# Patient Record
Sex: Male | Born: 1940 | Race: White | Hispanic: No | Marital: Married | Smoking: Never smoker
Health system: Southern US, Community
[De-identification: ages and names within clinical notes are randomized; demographics above are authoritative.]

---

## 2000-11-20 ENCOUNTER — Encounter: Payer: Self-pay | Admitting: Occupational Medicine

## 2000-11-20 ENCOUNTER — Encounter: Admission: RE | Admit: 2000-11-20 | Discharge: 2000-11-20 | Payer: Self-pay | Admitting: Occupational Medicine

## 2003-10-12 ENCOUNTER — Encounter: Admission: RE | Admit: 2003-10-12 | Discharge: 2003-10-12 | Payer: Self-pay | Admitting: Internal Medicine

## 2003-10-13 ENCOUNTER — Encounter: Admission: RE | Admit: 2003-10-13 | Discharge: 2003-10-13 | Payer: Self-pay | Admitting: Internal Medicine

## 2003-11-29 ENCOUNTER — Encounter: Admission: RE | Admit: 2003-11-29 | Discharge: 2003-11-29 | Payer: Self-pay | Admitting: Internal Medicine

## 2004-01-05 ENCOUNTER — Encounter: Admission: RE | Admit: 2004-01-05 | Discharge: 2004-01-05 | Payer: Self-pay | Admitting: Internal Medicine

## 2004-01-10 ENCOUNTER — Encounter: Admission: RE | Admit: 2004-01-10 | Discharge: 2004-01-10 | Payer: Self-pay | Admitting: Internal Medicine

## 2004-03-27 ENCOUNTER — Encounter: Admission: RE | Admit: 2004-03-27 | Discharge: 2004-03-27 | Payer: Self-pay | Admitting: Internal Medicine

## 2004-08-31 ENCOUNTER — Ambulatory Visit: Payer: Self-pay | Admitting: Internal Medicine

## 2004-09-20 ENCOUNTER — Ambulatory Visit: Payer: Self-pay | Admitting: Internal Medicine

## 2004-10-19 ENCOUNTER — Ambulatory Visit: Payer: Self-pay | Admitting: Internal Medicine

## 2004-11-06 ENCOUNTER — Ambulatory Visit: Payer: Self-pay | Admitting: Internal Medicine

## 2004-11-19 HISTORY — PX: REPLACEMENT TOTAL KNEE BILATERAL: SUR1225

## 2004-12-27 ENCOUNTER — Ambulatory Visit: Payer: Self-pay | Admitting: Internal Medicine

## 2005-06-18 ENCOUNTER — Ambulatory Visit: Payer: Self-pay | Admitting: Internal Medicine

## 2005-07-18 ENCOUNTER — Ambulatory Visit (HOSPITAL_BASED_OUTPATIENT_CLINIC_OR_DEPARTMENT_OTHER): Admission: RE | Admit: 2005-07-18 | Discharge: 2005-07-18 | Payer: Self-pay | Admitting: Urology

## 2005-07-18 ENCOUNTER — Ambulatory Visit (HOSPITAL_COMMUNITY): Admission: RE | Admit: 2005-07-18 | Discharge: 2005-07-18 | Payer: Self-pay | Admitting: Urology

## 2005-11-14 ENCOUNTER — Inpatient Hospital Stay (HOSPITAL_COMMUNITY): Admission: RE | Admit: 2005-11-14 | Discharge: 2005-11-18 | Payer: Self-pay | Admitting: Orthopedic Surgery

## 2005-11-14 DIAGNOSIS — Z96659 Presence of unspecified artificial knee joint: Secondary | ICD-10-CM

## 2005-12-27 ENCOUNTER — Ambulatory Visit: Payer: Self-pay | Admitting: Internal Medicine

## 2005-12-31 ENCOUNTER — Ambulatory Visit: Payer: Self-pay | Admitting: Internal Medicine

## 2006-06-21 ENCOUNTER — Ambulatory Visit: Payer: Self-pay | Admitting: Hospitalist

## 2006-07-30 ENCOUNTER — Ambulatory Visit: Payer: Self-pay | Admitting: Internal Medicine

## 2006-10-02 DIAGNOSIS — E785 Hyperlipidemia, unspecified: Secondary | ICD-10-CM

## 2006-10-02 DIAGNOSIS — I10 Essential (primary) hypertension: Secondary | ICD-10-CM | POA: Insufficient documentation

## 2006-10-28 ENCOUNTER — Ambulatory Visit: Payer: Self-pay | Admitting: Internal Medicine

## 2006-10-29 ENCOUNTER — Encounter (INDEPENDENT_AMBULATORY_CARE_PROVIDER_SITE_OTHER): Payer: Self-pay | Admitting: *Deleted

## 2006-10-29 ENCOUNTER — Ambulatory Visit: Payer: Self-pay | Admitting: Internal Medicine

## 2006-10-29 LAB — CONVERTED CEMR LAB
ALT: 24 units/L (ref 0–53)
AST: 18 units/L (ref 0–37)
Albumin: 4.6 g/dL (ref 3.5–5.2)
Alkaline Phosphatase: 97 units/L (ref 39–117)
BUN: 17 mg/dL (ref 6–23)
CO2: 30 meq/L (ref 19–32)
Calcium: 9.6 mg/dL (ref 8.4–10.5)
Chloride: 102 meq/L (ref 96–112)
Cholesterol: 248 mg/dL — ABNORMAL HIGH (ref 0–200)
Creatinine, Ser: 1.13 mg/dL (ref 0.40–1.50)
Glucose, Bld: 101 mg/dL — ABNORMAL HIGH (ref 70–99)
HCT: 48.6 % (ref 41.0–49.0)
HDL: 33 mg/dL — ABNORMAL LOW (ref >39)
Hemoglobin: 16 g/dL (ref 13.9–16.8)
LDL Cholesterol: 179 mg/dL — ABNORMAL HIGH (ref 0–99)
MCHC: 32.9 g/dL — ABNORMAL LOW (ref 33.1–35.4)
MCV: 99.2 fL (ref 78.8–100.0)
Platelets: 239 10*3/uL (ref 152–374)
Potassium: 4.6 meq/L (ref 3.5–5.3)
RBC: 4.9 M/uL (ref 4.20–5.50)
RDW: 13.5 % (ref 11.5–15.3)
Sodium: 144 meq/L (ref 135–145)
TSH: 0.969 microintl units/mL (ref 0.350–5.50)
Total Bilirubin: 0.9 mg/dL (ref 0.3–1.2)
Total CHOL/HDL Ratio: 7.5
Total Protein: 7.1 g/dL (ref 6.0–8.3)
Triglycerides: 181 mg/dL — ABNORMAL HIGH (ref <150)
VLDL: 36 mg/dL (ref 0–40)
WBC: 7.1 10*3/uL (ref 3.7–10.0)

## 2006-11-14 ENCOUNTER — Inpatient Hospital Stay (HOSPITAL_COMMUNITY): Admission: RE | Admit: 2006-11-14 | Discharge: 2006-11-17 | Payer: Self-pay | Admitting: Orthopedic Surgery

## 2006-11-23 DIAGNOSIS — J301 Allergic rhinitis due to pollen: Secondary | ICD-10-CM

## 2006-11-27 DIAGNOSIS — M1990 Unspecified osteoarthritis, unspecified site: Secondary | ICD-10-CM | POA: Insufficient documentation

## 2006-11-27 DIAGNOSIS — N471 Phimosis: Secondary | ICD-10-CM

## 2006-11-27 DIAGNOSIS — Z85820 Personal history of malignant melanoma of skin: Secondary | ICD-10-CM | POA: Insufficient documentation

## 2006-11-27 DIAGNOSIS — N478 Other disorders of prepuce: Secondary | ICD-10-CM | POA: Insufficient documentation

## 2006-12-02 ENCOUNTER — Ambulatory Visit: Payer: Self-pay | Admitting: Hospitalist

## 2006-12-04 ENCOUNTER — Encounter: Admission: RE | Admit: 2006-12-04 | Discharge: 2007-01-16 | Payer: Self-pay | Admitting: Orthopedic Surgery

## 2006-12-05 ENCOUNTER — Ambulatory Visit: Payer: Self-pay | Admitting: Internal Medicine

## 2006-12-05 LAB — CONVERTED CEMR LAB: INR: 1.6

## 2006-12-10 ENCOUNTER — Ambulatory Visit: Payer: Self-pay | Admitting: Internal Medicine

## 2006-12-10 ENCOUNTER — Encounter (INDEPENDENT_AMBULATORY_CARE_PROVIDER_SITE_OTHER): Payer: Self-pay | Admitting: *Deleted

## 2006-12-10 LAB — CONVERTED CEMR LAB: INR: 1.9

## 2007-03-11 ENCOUNTER — Telehealth (INDEPENDENT_AMBULATORY_CARE_PROVIDER_SITE_OTHER): Payer: Self-pay | Admitting: *Deleted

## 2007-03-12 ENCOUNTER — Ambulatory Visit: Payer: Self-pay | Admitting: Internal Medicine

## 2007-03-12 ENCOUNTER — Encounter (INDEPENDENT_AMBULATORY_CARE_PROVIDER_SITE_OTHER): Payer: Self-pay | Admitting: *Deleted

## 2007-03-12 LAB — CONVERTED CEMR LAB
ALT: 22 units/L (ref 0–53)
Albumin: 4.7 g/dL (ref 3.5–5.2)
CO2: 26 meq/L (ref 19–32)
Chloride: 101 meq/L (ref 96–112)
Glucose, Bld: 91 mg/dL (ref 70–99)
Potassium: 4 meq/L (ref 3.5–5.3)
Sodium: 141 meq/L (ref 135–145)
Total Protein: 7.6 g/dL (ref 6.0–8.3)

## 2007-03-13 ENCOUNTER — Telehealth (INDEPENDENT_AMBULATORY_CARE_PROVIDER_SITE_OTHER): Payer: Self-pay | Admitting: *Deleted

## 2007-10-15 ENCOUNTER — Telehealth (INDEPENDENT_AMBULATORY_CARE_PROVIDER_SITE_OTHER): Payer: Self-pay | Admitting: *Deleted

## 2007-12-05 ENCOUNTER — Encounter (INDEPENDENT_AMBULATORY_CARE_PROVIDER_SITE_OTHER): Payer: Self-pay | Admitting: *Deleted

## 2008-04-20 ENCOUNTER — Telehealth (INDEPENDENT_AMBULATORY_CARE_PROVIDER_SITE_OTHER): Payer: Self-pay | Admitting: *Deleted

## 2008-06-28 ENCOUNTER — Ambulatory Visit: Payer: Self-pay | Admitting: Internal Medicine

## 2008-06-28 ENCOUNTER — Encounter (INDEPENDENT_AMBULATORY_CARE_PROVIDER_SITE_OTHER): Payer: Self-pay | Admitting: *Deleted

## 2008-06-28 LAB — CONVERTED CEMR LAB
ALT: 31 units/L (ref 0–53)
BUN: 15 mg/dL (ref 6–23)
CO2: 28 meq/L (ref 19–32)
Calcium: 9.6 mg/dL (ref 8.4–10.5)
Chloride: 101 meq/L (ref 96–112)
Cholesterol: 171 mg/dL (ref 0–200)
Creatinine, Ser: 1.05 mg/dL (ref 0.40–1.50)
Potassium: 4.1 meq/L (ref 3.5–5.3)
Sodium: 144 meq/L (ref 135–145)
Total CHOL/HDL Ratio: 4.9

## 2008-08-03 IMAGING — CR DG CHEST 2V
2 series · 2 of 2 positions shown · non-contrast
Comparison: 11/05/05.

CLINICAL DATA: Osteoarthritis, right knee.  Preoperative chest.
 CHEST - 2 VIEW:

[view not recorded (1 of 2)]
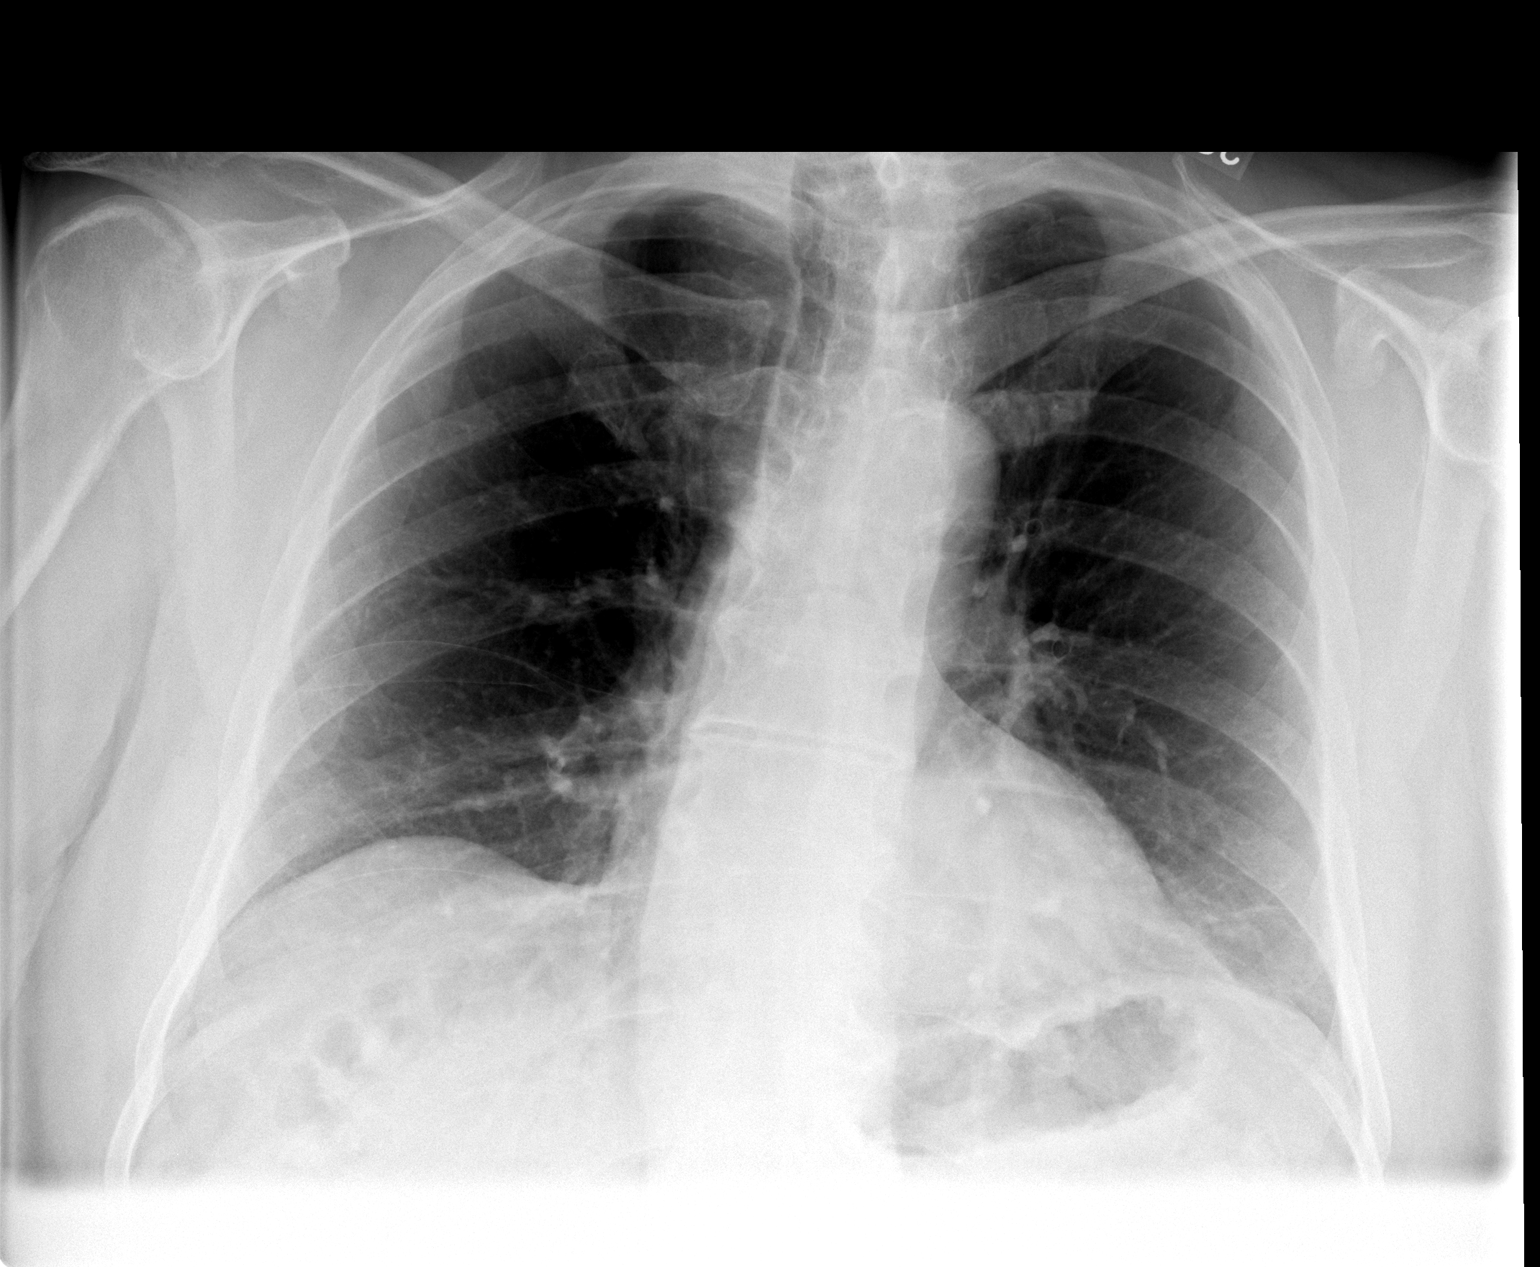

[view not recorded (2 of 2)]
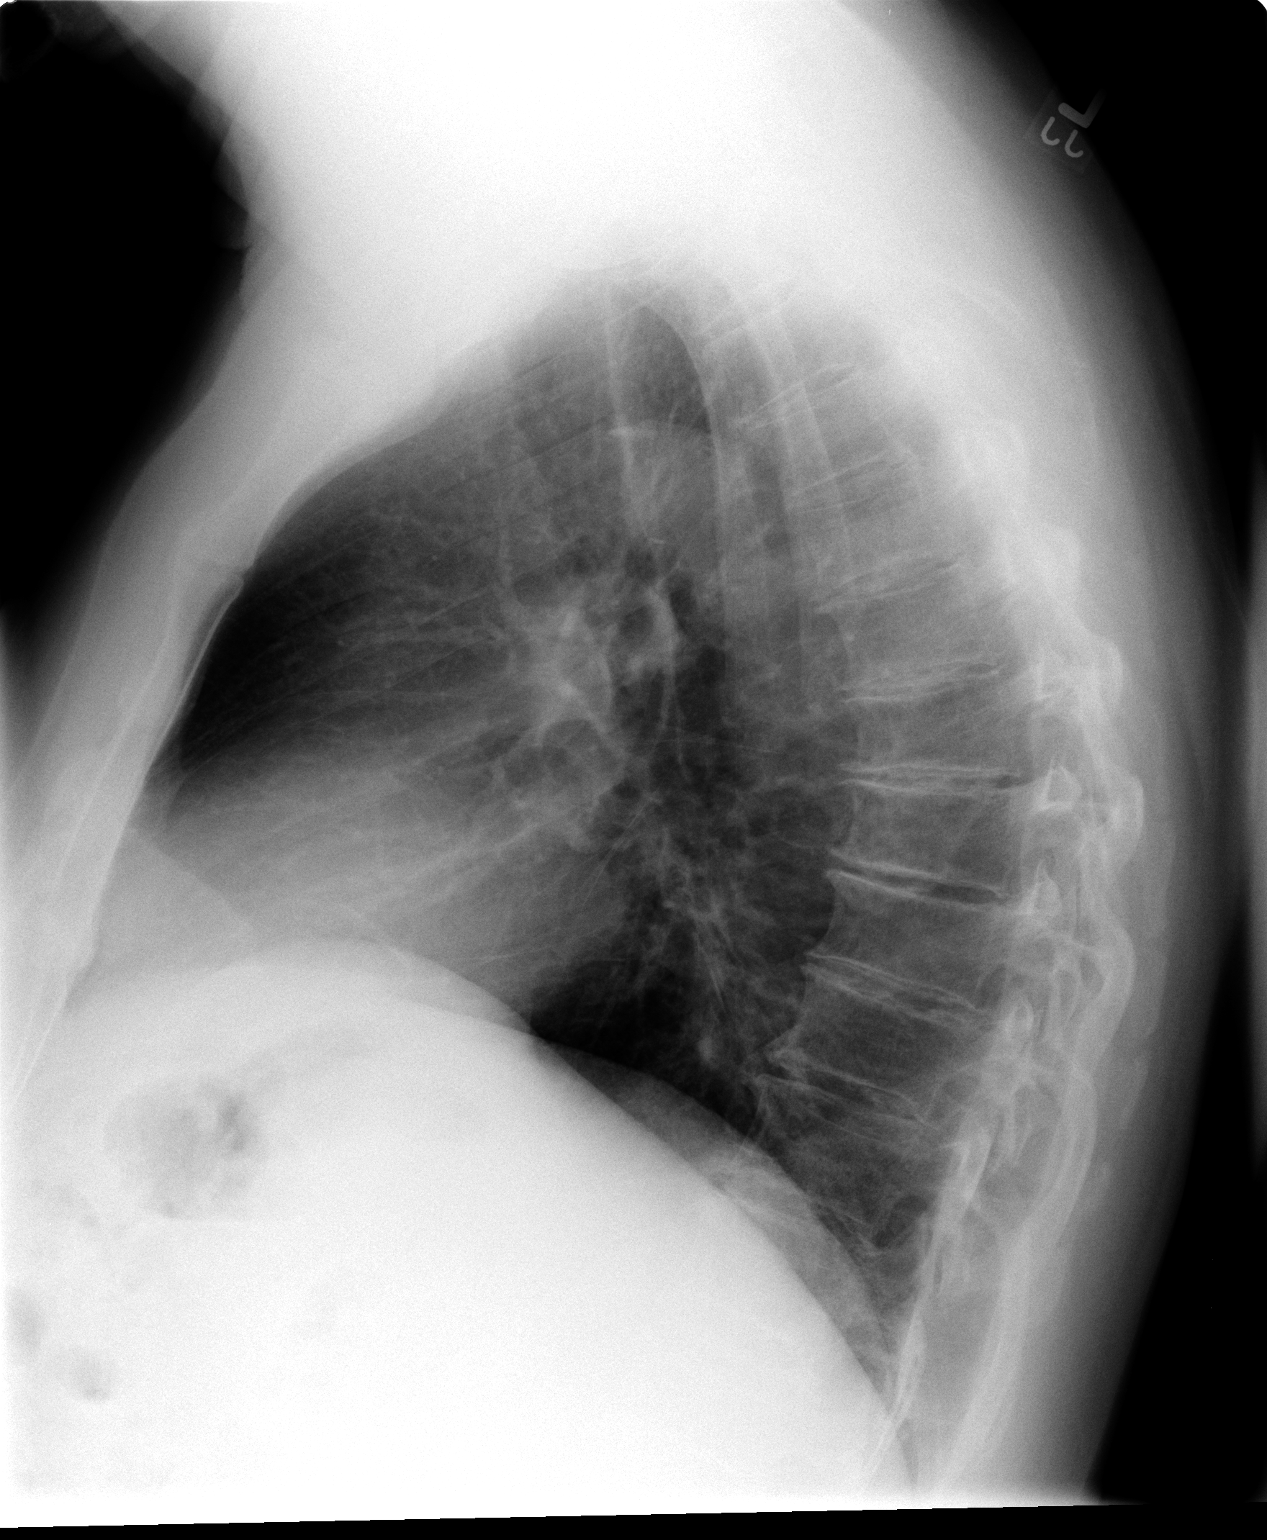

[2 of 2 positions shown; findings below may reference images not displayed]

The lungs are clear. The heart and mediastinal structures are normal.  There are mild chronic bronchitic changes.  There is mild scoliosis--stable.
IMPRESSION: Mild stable chronic bronchitic changes.  No evidence for active chest disease radiographically.

## 2008-08-12 IMAGING — CR DG KNEE 1-2V*R*
2 series · 2 of 2 positions shown · non-contrast
Comparison: none

CLINICAL DATA: OA right knee. Postop total knee replacement.
 RIGHT KNEE ? 2 VIEW:

[view not recorded (1 of 2)]
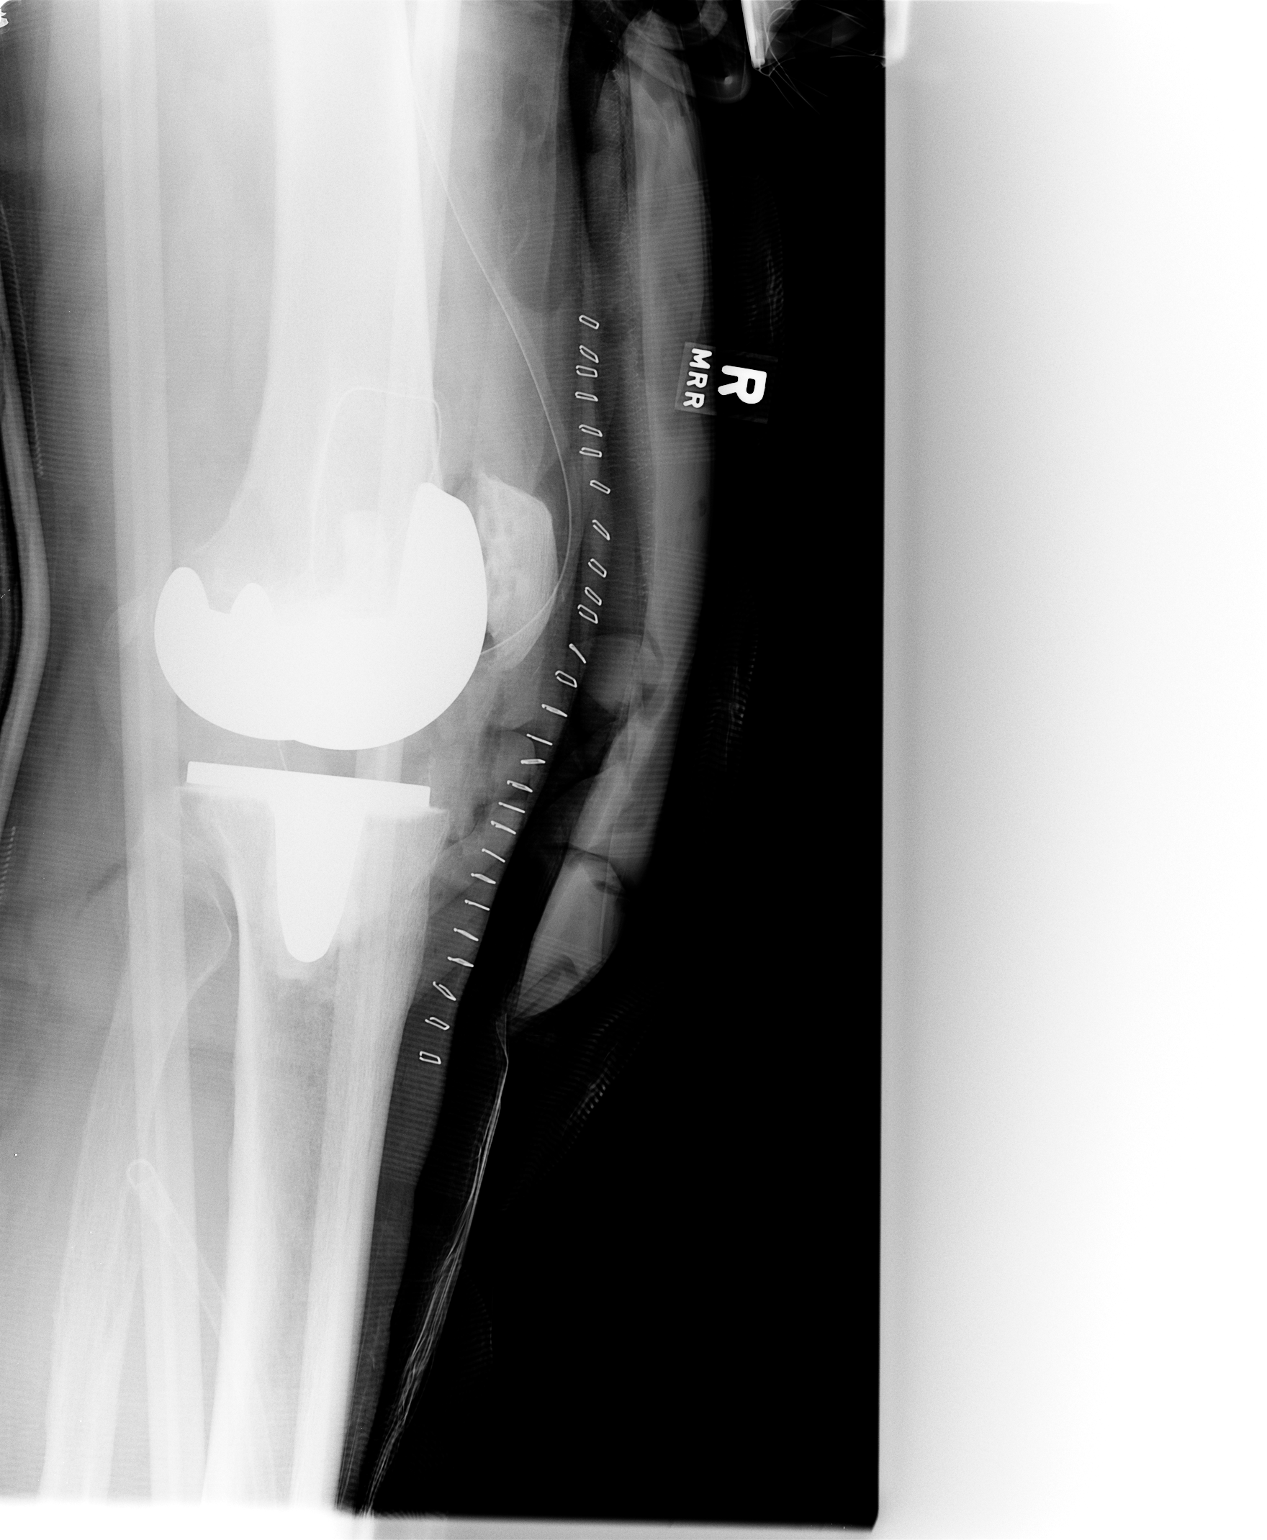

[view not recorded (2 of 2)]
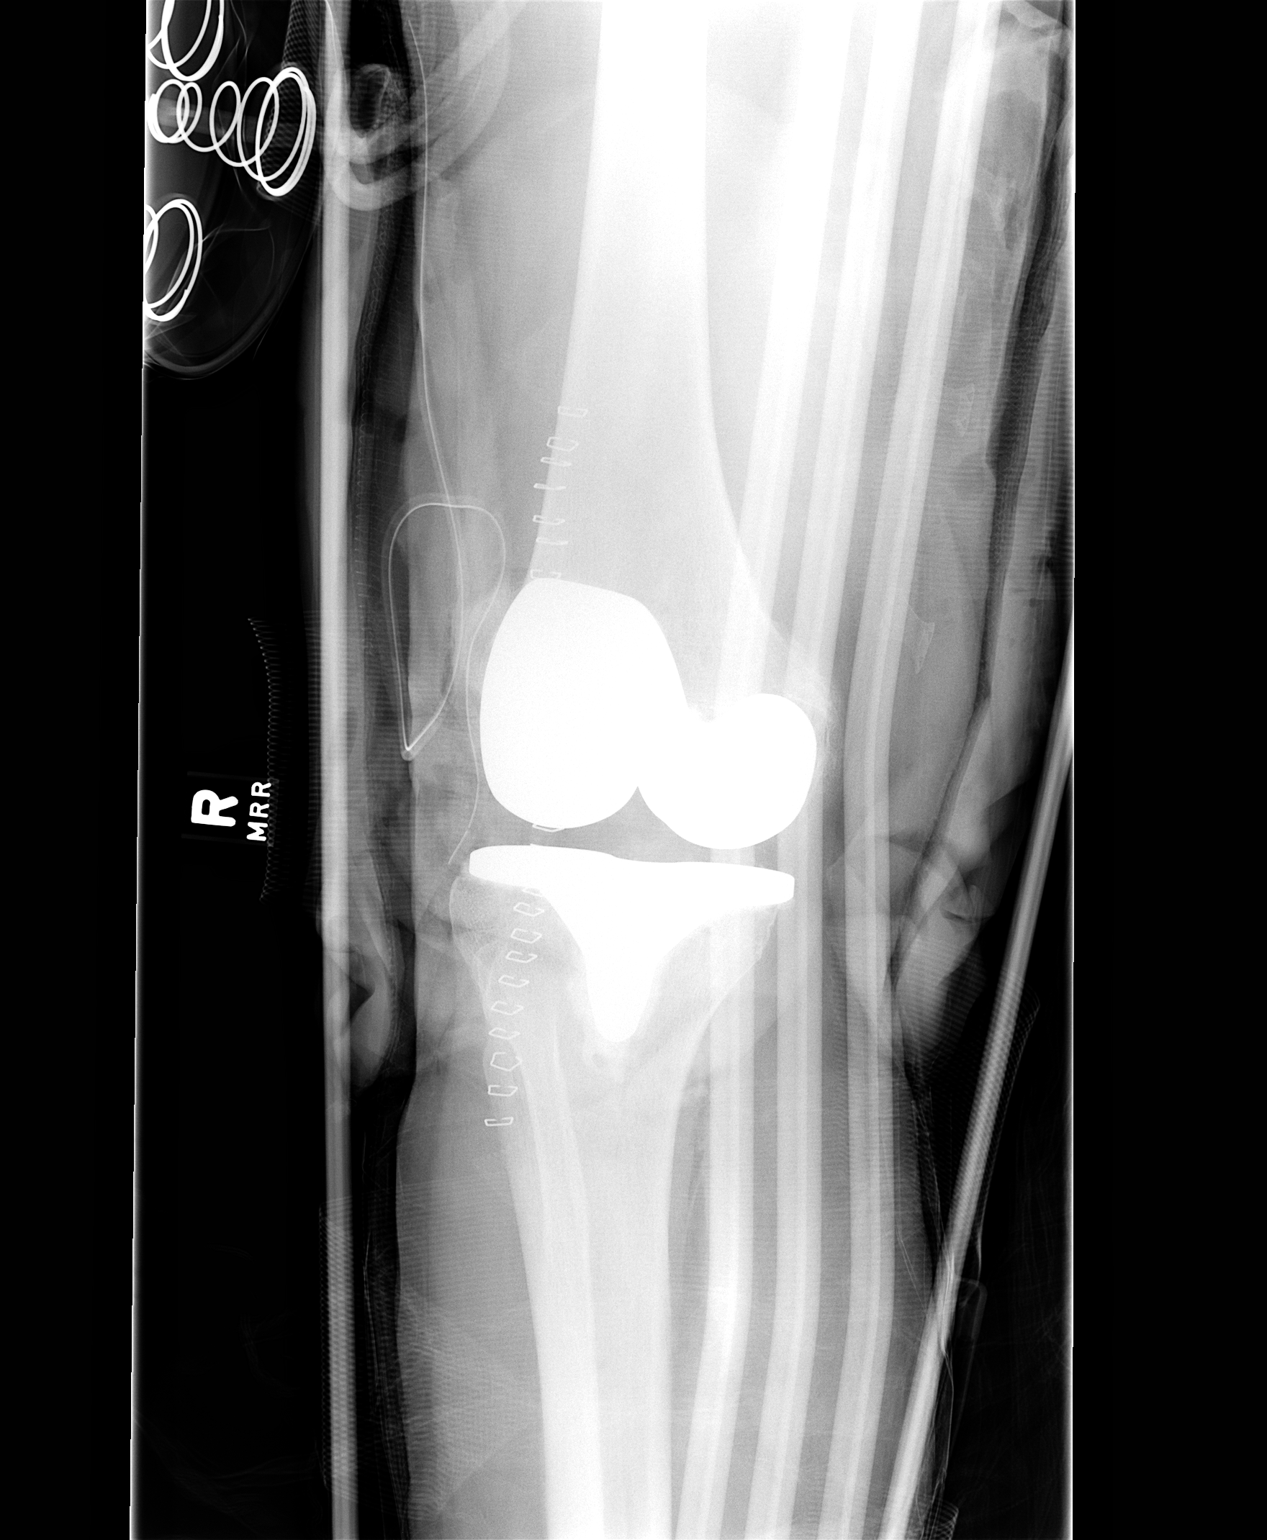

[2 of 2 positions shown; findings below may reference images not displayed]

FINDINGS: Received are 2 views which are not true AP and lateral views.  The views were taken at some degree of obliquity.
 Distal femoral proximal tibia and patella prosthetic components appear in satisfactory position and alignment.  Vertical row of superficial staples anteriorly.  Radiopaque drain is also noted.
IMPRESSION: Satisfactory appearance of right total knee replacement.

## 2008-10-25 ENCOUNTER — Telehealth: Payer: Self-pay | Admitting: Internal Medicine

## 2008-12-17 ENCOUNTER — Ambulatory Visit: Payer: Self-pay | Admitting: Family Medicine

## 2008-12-17 DIAGNOSIS — K219 Gastro-esophageal reflux disease without esophagitis: Secondary | ICD-10-CM

## 2009-01-21 ENCOUNTER — Encounter: Payer: Self-pay | Admitting: Family Medicine

## 2009-01-21 ENCOUNTER — Telehealth (INDEPENDENT_AMBULATORY_CARE_PROVIDER_SITE_OTHER): Payer: Self-pay | Admitting: *Deleted

## 2009-01-31 ENCOUNTER — Ambulatory Visit: Payer: Self-pay | Admitting: Family Medicine

## 2009-02-01 LAB — CONVERTED CEMR LAB
AST: 23 units/L (ref 0–37)
Calcium: 9.2 mg/dL (ref 8.4–10.5)
Chloride: 103 meq/L (ref 96–112)
Creatinine, Ser: 1.1 mg/dL (ref 0.4–1.5)
GFR calc Af Amer: 85 mL/min
GFR calc non Af Amer: 71 mL/min
LDL Cholesterol: 114 mg/dL — ABNORMAL HIGH (ref 0–99)
PSA: 2.31 ng/mL (ref 0.10–4.00)
Total Bilirubin: 1.1 mg/dL (ref 0.3–1.2)
Total CHOL/HDL Ratio: 5.4
Triglycerides: 55 mg/dL (ref 0–149)

## 2009-02-07 ENCOUNTER — Ambulatory Visit: Payer: Self-pay | Admitting: Family Medicine

## 2009-02-07 DIAGNOSIS — E119 Type 2 diabetes mellitus without complications: Secondary | ICD-10-CM

## 2009-02-08 LAB — CONVERTED CEMR LAB
Creatinine,U: 116.5 mg/dL
Hgb A1c MFr Bld: 6.8 % — ABNORMAL HIGH (ref 4.6–6.5)
Microalb Creat Ratio: 6 mg/g (ref 0.0–30.0)
Microalb, Ur: 0.7 mg/dL (ref 0.0–1.9)

## 2009-03-03 ENCOUNTER — Ambulatory Visit: Payer: Self-pay | Admitting: Family Medicine

## 2009-03-03 DIAGNOSIS — J019 Acute sinusitis, unspecified: Secondary | ICD-10-CM

## 2009-03-10 ENCOUNTER — Ambulatory Visit: Payer: Self-pay | Admitting: Family Medicine

## 2009-03-11 ENCOUNTER — Encounter (INDEPENDENT_AMBULATORY_CARE_PROVIDER_SITE_OTHER): Payer: Self-pay | Admitting: *Deleted

## 2009-04-01 ENCOUNTER — Telehealth: Payer: Self-pay | Admitting: Family Medicine

## 2009-05-09 ENCOUNTER — Ambulatory Visit: Payer: Self-pay | Admitting: Family Medicine

## 2009-05-16 ENCOUNTER — Ambulatory Visit: Payer: Self-pay | Admitting: Family Medicine

## 2009-05-16 LAB — CONVERTED CEMR LAB
Calcium: 9.3 mg/dL (ref 8.4–10.5)
GFR calc non Af Amer: 63.63 mL/min (ref >60)
HDL goal, serum: 40 mg/dL
Hgb A1c MFr Bld: 6.1 % (ref 4.6–6.5)
LDL Cholesterol: 100 mg/dL — ABNORMAL HIGH (ref 0–99)
LDL Goal: 100 mg/dL
Potassium: 3.9 meq/L (ref 3.5–5.1)
Sodium: 143 meq/L (ref 135–145)
VLDL: 12.8 mg/dL (ref 0.0–40.0)

## 2009-08-10 ENCOUNTER — Ambulatory Visit: Payer: Self-pay | Admitting: Family Medicine

## 2009-08-23 ENCOUNTER — Ambulatory Visit: Payer: Self-pay | Admitting: Family Medicine

## 2010-02-13 ENCOUNTER — Ambulatory Visit: Payer: Self-pay | Admitting: Family Medicine

## 2010-02-15 LAB — CONVERTED CEMR LAB
ALT: 25 units/L (ref 0–53)
Albumin: 4 g/dL (ref 3.5–5.2)
BUN: 19 mg/dL (ref 6–23)
Chloride: 101 meq/L (ref 96–112)
Cholesterol: 132 mg/dL (ref 0–200)
Creatinine, Ser: 1.1 mg/dL (ref 0.4–1.5)
GFR calc non Af Amer: 70.19 mL/min (ref >60)
Glucose, Bld: 117 mg/dL — ABNORMAL HIGH (ref 70–99)
LDL Cholesterol: 87 mg/dL (ref 0–99)
Total Bilirubin: 0.9 mg/dL (ref 0.3–1.2)
Triglycerides: 50 mg/dL (ref 0.0–149.0)

## 2010-03-29 ENCOUNTER — Ambulatory Visit: Payer: Self-pay | Admitting: Family Medicine

## 2010-12-19 NOTE — Assessment & Plan Note (Signed)
Summary: 6 MONTH FOLLOW UP DM/RBH   Vital Signs:  Patient profile:   70 year old male Height:      70 inches Weight:      232 pounds BMI:     33.41 Temp:     98.2 degrees F oral Pulse rate:   68 / minute Pulse rhythm:   regular BP sitting:   130 / 62  (left arm) Cuff size:   regular  Vitals Entered By: Linde Gillis CMA Duncan Dull) (Mar 29, 2010 2:35 PM) CC: 6 month follow up   History of Present Illness: Feeling well overeall, but alot of stress..so not taking care of himself.  not sticking to diet, weight gain 10 lbs, limited exercsie.  Started walking 2 times a week. Not checking blood sugar at home.   Problems Prior to Update: 1)  Special Screening Malig Neoplasms Other Sites  (ICD-V76.49) 2)  Sinusitis - Acute-nos  (ICD-461.9) 3)  Dm  (ICD-250.00) 4)  Special Screening Malignant Neoplasm of Prostate  (ICD-V76.44) 5)  Gerd  (ICD-530.81) 6)  Knee Replacement, Right, Hx of  (ICD-V43.65) 7)  Coumadin Therapy  (ICD-V58.61) 8)  Hx, Personal, Malignancy, Skin Melanoma  (ICD-V10.82) 9)  Redundant Prepuce/phimosis  (ICD-605) 10)  Osteoarthritis  (ICD-715.90) 11)  Knee Replacement, Left, Hx of  (ICD-V43.65) 12)  Allergic Rhinitis, Seasonal  (ICD-477.0) 13)  Hypertension  (ICD-401.9) 14)  Hyperlipidemia  (ICD-272.4)  Current Medications (verified): 1)  Hydrochlorothiazide 25 Mg Tabs (Hydrochlorothiazide) .... Take 1 Tablet By Mouth Once A Day 2)  Zyrtec 10 Mg Tabs (Cetirizine Hcl) .... Take 1 Tablet By Mouth Once A Day As Needed 3)  Aspir-Low 81 Mg Tbec (Aspirin) .... Take 1 Tablet By Mouth Once A Day 4)  Tylenol Extra Strength 500 Mg Tabs (Acetaminophen) .... As Needed 5)  Nasonex 50 Mcg/act Susp (Mometasone Furoate) .... 2 Sprays in Each Nostril Once Daily As Needed 6)  Lipitor 20 Mg Tabs (Atorvastatin Calcium) .... Take 1 Tablet By Mouth Once A Day  Allergies (verified): No Known Drug Allergies  Past History:  Past medical, surgical, family and social histories (including  risk factors) reviewed, and no changes noted (except as noted below).  Past Medical History: Reviewed history from 12/17/2008 and no changes required. Hyperlipidemia, LDL 179 Hypertension Seasonal allergies h/o melanoma, excised 7/06 Dr. Lonni Fix Osteoarthritis s/p L TKR 2006 R TKR Dec 2007 GERD  Past Surgical History: Reviewed history from 12/17/2008 and no changes required. Total knee replacemen bilateralt:  Dec 06 left and right 12007 circumsicion 1993  Family History: Reviewed history from 12/17/2008 and no changes required. father:allergies, HTN, colon cancer age 29 mother: hip fracture, A fib? on coumadin unsure why MGM: CVA MGF: CVA PGF: CAD  Social History: Reviewed history from 12/17/2008 and no changes required. Occupation: retired Married 4 children: healthy except allergies Former Smoker 10 pack year history Alcohol use-no Drug use-no Regular exercise-yes Diet: changing to healthy diet  Review of Systems General:  Denies fatigue and fever. CV:  Denies chest pain or discomfort. Resp:  Denies shortness of breath. GI:  Denies abdominal pain. GU:  Denies dysuria.  Physical Exam  General:  Overweight appearing male in NAd Mouth:  MMM Neck:  no carotid bruit or thyromegaly no cervical or supraclavicular lymphadenopathy  Lungs:  Normal respiratory effort, chest expands symmetrically. Lungs are clear to auscultation, no crackles or wheezes. Abdomen:  Bowel sounds positive,abdomen soft and non-tender without masses, organomegaly or hernias noted. Pulses:  R and L posterior tibial pulses are  full and equal bilaterally  Extremities:  no edema, B varicosities healing right lower leg..skin cancer biopsy Skin:  multiple areas treated at [facility] recently,  Diabetes Management Exam:    Foot Exam (with socks and/or shoes not present):       Sensory-Pinprick/Light touch:          Left medial foot (L-4): normal          Left dorsal foot (L-5): normal          Left  lateral foot (S-1): normal          Right medial foot (L-4): normal          Right dorsal foot (L-5): normal          Right lateral foot (S-1): normal       Sensory-Monofilament:          Left foot: normal          Right foot: normal       Inspection:          Left foot: normal          Right foot: normal       Nails:          Left foot: normal          Right foot: normal   Impression & Recommendations:  Problem # 1:  DM (ICD-250.00) Worsened control. Get back on control with diet. Encouraged exercise, weight loss, healthy eating habits.  His updated medication list for this problem includes:    Aspir-low 81 Mg Tbec (Aspirin) .Marland Kitchen... Take 1 tablet by mouth once a day  Problem # 2:  HYPERTENSION (ICD-401.9) Well controrlled.Marland Kitchenat goal <130/80.  His updated medication list for this problem includes:    Hydrochlorothiazide 25 Mg Tabs (Hydrochlorothiazide) .Marland Kitchen... Take 1 tablet by mouth once a day  Problem # 3:  HYPERLIPIDEMIA (ICD-272.4) Improved control.  His updated medication list for this problem includes:    Lipitor 20 Mg Tabs (Atorvastatin calcium) .Marland Kitchen... Take 1 tablet by mouth once a day  Labs Reviewed: SGOT: 21 (02/13/2010)   SGPT: 25 (02/13/2010)  Lipid Goals: Chol Goal: 200 (05/16/2009)   HDL Goal: 40 (05/16/2009)   LDL Goal: 100 (05/16/2009)   TG Goal: 150 (05/16/2009)  Prior 10 Yr Risk Heart Disease: 40 % (08/23/2009)   HDL:35.00 (02/13/2010), 34.00 (05/09/2009)  LDL:87 (02/13/2010), 100 (04/54/0981)  Chol:132 (02/13/2010), 147 (05/09/2009)  Trig:50.0 (02/13/2010), 64.0 (05/09/2009)  Complete Medication List: 1)  Hydrochlorothiazide 25 Mg Tabs (Hydrochlorothiazide) .... Take 1 tablet by mouth once a day 2)  Zyrtec 10 Mg Tabs (Cetirizine hcl) .... Take 1 tablet by mouth once a day as needed 3)  Aspir-low 81 Mg Tbec (Aspirin) .... Take 1 tablet by mouth once a day 4)  Tylenol Extra Strength 500 Mg Tabs (Acetaminophen) .... As needed 5)  Nasonex 50 Mcg/act Susp  (Mometasone furoate) .... 2 sprays in each nostril once daily as needed 6)  Lipitor 20 Mg Tabs (Atorvastatin calcium) .... Take 1 tablet by mouth once a day  Patient Instructions: 1)  Schedule CPX in 3 month.  2)  HgBA1c prior to visit  ICD-9: 250.00 3)  Urine Microalbumin prior to visit ICD-9 :   Current Allergies (reviewed today): No known allergies

## 2011-04-06 NOTE — Op Note (Signed)
NAME:  Preston Green, Preston Green                 ACCOUNT NO.:  [id number]   MEDICAL RECORD NO.:  [id number]          PATIENT TYPE:  AMB   LOCATION:  [facility]                         FACILITY:  [facility]   PHYSICIAN:  Valetta Fuller, M.D.  DATE OF BIRTH:  [birth year]   DATE OF PROCEDURE:  07/18/2005  DATE OF DISCHARGE:                                 OPERATIVE REPORT   PREOPERATIVE DIAGNOSIS:  Severe phimosis with recurrent balanitis.   POSTOPERATIVE DIAGNOSIS:  Severe phimosis with recurrent balanitis.   PROCEDURE PERFORMED:  Circumcision.   SURGEON:  Valetta Fuller, M.D.   ANESTHESIA:  IV sedation with penile block.   INDICATIONS:  Mr. Flinchum is a 70 year old male.  He presented with a several  year history of increasing difficulty and now inability to retract his  foreskin.  He had some problems with recurrent balanitis and had some  bleeding and discomfort with intercourse.  On exam, the patient had severe  phimosis and we were unable to retract his foreskin.  For that reason we  recommended consideration for circumcision.  The patient appeared understand  the advantages and disadvantages as well as potential complications.   DESCRIPTION OF PROCEDURE:  The patient was brought to the operating room  where he had intravenous sedation administered.  A penile block was then  performed with lidocaine and Marcaine.  The penis was then prepped and  draped in the usual sterile manner. Of note, no latex was used during the  course the procedure because of a history of latex allergy.  The patient was  then prepped and draped in the usual manner.  An additional penile block was  performed and the anesthesia was excellent.  We performed a dorsal slit  initially to expose the glans penis. There was marked erythema and chronic  fibrotic changes consistent with the severe phimosis and recurrent  balanitis.  A circumferential incision was made around the glans penis and  then a second incision was made within the  very inflamed and scarred mucosal  collar.  The redundant sleeve of tissue was removed.  The area was copiously  irrigated, and the glans penis was reprepped.  The skin edges were  reapproximated with interrupted 4-0 Vicryl suture.  A very light pressure  dressing with Xeroform and Bacitracin was applied.  The patient appeared to  tolerate the procedure well.  There were no obvious complications.           ______________________________  Valetta Fuller, M.D.     DSG/MEDQ  D:  07/18/2005  T:  07/18/2005  Job:  [id number]

## 2011-04-06 NOTE — Op Note (Signed)
NAMEMarland Kitchen  Preston Green, Preston Green                 ACCOUNT NO.:  [id number]   MEDICAL RECORD NO.:  [id number]          PATIENT TYPE:  INP   LOCATION:  1508                         FACILITY:  [facility]   PHYSICIAN:  Georges Lynch. Gioffre, M.D.DATE OF BIRTH:  02-06-1941   DATE OF PROCEDURE:  11/14/2005  DATE OF DISCHARGE:                                 OPERATIVE REPORT   SURGEON:  Georges Lynch. Darrelyn Hillock, M.D.   ASSISTANT:  Jamelle Rushing, P.A.   PREOPERATIVE DIAGNOSIS:  Severe degenerative arthritis, left knee.   POSTOPERATIVE DIAGNOSIS:  Severe degenerative arthritis, left knee.   OPERATION:  Left total knee arthroplasty utilizing the DePuy system. All  three components were cemented. I utilized the rotating platform tibial  insert. The sizes used were as follows:  The tibial tray was a size 5, the  tibial insert was a size 5, 10 mm thickness, the patella was a size 41 mm.  The femoral component was a size five left posterior cruciate sacrificing  type, and the tibial tray was a size 5. Note all three components were  cemented and vancomycin was used in the cement.   DESCRIPTION OF PROCEDURE:  Under spinal anesthesia, routine orthopedic prep  and draping of the left lower extremity was carried out. He had 1 gram of IV  Ancef preop. The leg was exsanguinated with an Esmarch and tourniquet was  elevated at 375 mmHg. Note he had a flexion contracture preop. At this time,  an incision was made over the anterior aspect of the left knee with the knee  flexed, bleeders identified and cauterized. Two flaps were created. Self-  retaining retractors were inserted. I then carried out a median parapatellar  incision, reflected the patella laterally, flexed the knee and did lateral  medial meniscectomies and then excised the anterior and posterior cruciate  ligaments. We then removed all the spurs from the patella, femur and tibia.  Next the initial drill hole was made in the intercondylar notch of the  femur, we was  thoroughly irrigated out the femoral canal. A #1 jig was  inserted, we removed 12 mm thickness off the distal femur. Next, a #2 jig  was inserted for a size 5 femoral component and at this time we carried our  anterior, posterior and chamfering cuts. Following that, we then prepared  our tibia in the usual fashion utilizing intramedullary guides. We initially  removed 6 mm off the unaffected side and then took another additional 4 mm  off the unaffected side. We then noted that when we withdrew the trials we  had good stability. Prior to doing the trials, we did our notch cut out of  the femur in the usual fashion for a size five left femur. We then inserted  our trials and went through trial range of motion. We had good lateral and  medial stability, had good flexion extension with a 10 mm thickness tray in  place. We then prepared our patella, we removed the appropriate amount of  bone from our patella. We did a resurfacing procedure. Three drill holes  then were made  in the patella for a size 41 patella. All the components were  removed, we thoroughly water picked out the knee,  removed all the soft  tissue that was basically as far as doing a synovectomy. We then dried the  knee out, we inserted some Gelfoam into the femoral canal and tibial canal  and then cemented all three components in simultaneously. VANCOMYCIN WAS  USED IN THE CEMENT. Once the cement was dry, we removed all loose pieces of  cement. We took our trial tibial insert and we looked posteriorly and made  sure there were no other loose pieces of cement present. We thoroughly water  picked out the knee again and we then inserted our permanent rotating  platform size 5, 10 mm thickness,  reduced the knee,  had good stability, good flexion, good extension. We  inserted our hemovac drain and then closed the knee in layers in the usual  fashion. Sterile dressings were applied. Surgeon, Dr. Darrelyn Hillock. Assistant,  Arlyn Leak,  PA.           ______________________________  Georges Lynch Darrelyn Hillock, M.D.     RAG/MEDQ  D:  11/14/2005  T:  11/14/2005  Job:  [id number]

## 2011-04-06 NOTE — Discharge Summary (Signed)
NAMEMarland Green  MURPHY, DUZAN                 ACCOUNT NO.:  [id number]   MEDICAL RECORD NO.:  [id number]          PATIENT TYPE:  INP   LOCATION:  1508                         FACILITY:  [facility]   PHYSICIAN:  Georges Lynch. Gioffre, M.D.DATE OF BIRTH:  [birth year]   DATE OF ADMISSION:  11/14/2005  DATE OF DISCHARGE:  11/18/2005                                 DISCHARGE SUMMARY   ADMISSION DIAGNOSES:  1.  Severe osteoarthritis of the knee.  2.  Hypertension.  3.  History of kidney stones.  4.  History of seasonal allergies.   DISCHARGE DIAGNOSES:  1.  Left total knee arthroplasty.  2.  History of hypertension.  3.  History of kidney stones.  4.  History of seasonal allergies.   HISTORY OF PRESENT ILLNESS:  The patient is a 70 year old gentleman  evaluated by Dr. Darrelyn Hillock for bilateral knee pains. He has failed  conservative treatment in his left knee including medications, arthroscopies  and injections. X-rays reveal significant arthritic changes throughout the  knee. He has significant pain at night plus difficulty with sleep. He has a  constant aching sensation which increases with any type of activity.   ALLERGIES:  RINDEE given in the 1980s.   CURRENT MEDICATIONS:  1.  Hydrochlorothiazide 25 mg a day.  2.  Zyrtec 10 mg a day.  3.  Lipitor 10 mg a day.  4.  Enalapril 40 mg a day.  5.  Nasonex spray p.r.n.   PROCEDURE:  On November 14, 2005, the patient was taken to the OR by Windy Fast  A. Gioffre, M.D., assisted by Jamelle Rushing, PA-C and under spinal  anesthesia the patient underwent a left total knee arthroplasty with the  following DePuy components:  A cemented 5 keel tibial tray, cemented 5 left  femoral component and a size 5, 10 mm polyethylene bearing, a size 41 three  peg patella. All components were implanted with polymethyl methacrylate and  vancomycin. The patient tolerated the procedure well and was transferred to  the recovery room and then to the orthopedic floor good  condition.   CONSULTATIONS:  The following routine consults requested:  Physical therapy,  occupational therapy, case management.   HOSPITAL COURSE:  The patient was admitted to [facility] on  November 14, 2005 under the care of Dr. Darrelyn Hillock. The patient was taken to  the OR where a left total knee arthroplasty was performed without any  complications. The patient tolerated the procedure well and was transferred  to the recovery room and then to the orthopedic floor with IV pain  medications, antibiotics starting on subcu heparin and Coumadin for DVT  prophylaxis and follow routine total knee protocol.   The patient then incurred a total of 3 days postoperative care on the  orthopedic floor in which the patient did develop some insignificant blood  loss. His vital signs remained stable without any problems with physical  therapy and any other activity. The patient's wound remained benign for any  signs of infection and his leg remained neuromotor vascular intact. The  patient worked well on a day to  day basis with physical therapy. He had no  other significant complaints and it was felt that on postoperative day #3  that he was orthopedically and medically ready for discharge home.  Arrangements were made for outpatient home health physical therapy and he  was discharged home in good condition.   LABORATORY DATA:  CBC on December 28, WBC was 9.5, hemoglobin 12.5,  hematocrit 37.0, platelets 190.   Routine chemistries on December 28, sodium 138, potassium of 5, glucose 145,  BUN 15, creatinine 1.1. Elevated glucose was felt to be due to inactivity  and routine postop strep and was just monitored. INR on December 31 was 2.1.   MEDICATIONS UPON DISCHARGE FROM THE ORTHOPEDIC FLOOR:  1.  Heparin 5000 units subcu q.12 h, discontinued due to therapeutic INR.  2.  Ferrous sulfate 325 mg p.o. t.i.d.  3.  Percocet 1 or 2 tablets every 4-6 h p.r.n.  4.  Reglan 10 mg p.o. q.8 h  p.r.n.  5.  Phenergan 25 mg p.o. q.6 h p.r.n.  6.  Robaxin 500 mg p.o. q.6 h p.r.n.  7.  Hydrochlorothiazide 25 mg p.o. daily.  8.  Claritin 10 mg p.o. p.r.n.  9.  Lipitor 40 mg a day.  10. Vasotec 40 mg a day.  11. Nasonex spray p.r.n.  12. Coumadin 7.5 mg a day and adjusted by pharmacy.   DISCHARGE INSTRUCTIONS:  1.  Diet - no restrictions.  2.  Activity - the patient is to walk with assistance with the use of      crutches or walker.  3.  Wound care - the patient should have dressing changed daily.  4.  Medications - the patient is to resume routine home medications with the      addition of the Percocet and Robaxin as directed. :  5.  Followup - the patient needs a followup appointment with Dr. Darrelyn Hillock 2      weeks from surgical date. He needs to      call the office to set up the appointment.  6.  [facility] provides physical therapy and INR evaluations.   CONDITION ON DISCHARGE:  Improved and good.      Jamelle Rushing, P.A.    ______________________________  Georges Lynch Darrelyn Hillock, M.D.    RWK/MEDQ  D:  11/29/2005  T:  11/30/2005  Job:  [id number]

## 2011-04-06 NOTE — H&P (Signed)
Preston Green, Preston Green             ACCOUNT NO.:  [id number]   MEDICAL RECORD NO.:  [id number]           PATIENT TYPE:   LOCATION:                                 FACILITY:   PHYSICIAN:  Georges Lynch. Gioffre, M.D.DATE OF BIRTH:  [birth year]   DATE OF ADMISSION:  11/14/2006  DATE OF DISCHARGE:                              HISTORY & PHYSICAL   CHIEF COMPLAINT:  Right knee pain.   HISTORY OF PRESENT ILLNESS:  The patient is a 70 year old gentleman  patient familiar with Dr. Darrelyn Hillock.  He has been evaluated and treated  for long periods of time for his arthritis in his right knee.  The  patient has noted loss of range of motion and pain with ambulation.  It  remains swollen.  The knee is no longer getting improvement with  conservative treatment.  The patient would like to proceed with a total  knee arthroplasty.   ALLERGIES:  NO KNOWN DRUG ALLERGIES.   CURRENT MEDICATIONS:  1. A water pill 25 mg a day.  2. Aspirin 81 mg a day.  3. Occasional over-the-counter allergy meds.   PRIMARY CARE PHYSICIAN:  Dr. Mervin Hack.   PAST SURGICAL HISTORY:  Positive for:  1. A left total knee arthroplasty.  2. Circumcision.  3. Left knee arthroscopy.  4. Removal of some skin lesions.   The patient denies any complications with anesthesia with the above-  mentioned surgical procedures other than constipation with narcotics.   FAMILY MEDICAL HISTORY:  Grandparents both had cardiac disease and are  deceased.  Mother is alive at 68 years of age with no significant  medical issues.  Father just has arthritis.   SOCIAL HISTORY:  The patient is married.  He is retired.  He lives in a  [place] house.  He denies any history of alcohol or tobacco use.   REVIEW OF SYSTEMS:  Positive for occasional headache, which have  significantly improved since retiring.  He does have a history of  hemorrhoids in the past, ulcers and kidney stones.  Otherwise, no  cardiac, respiratory, GI, GU or hematological issues that  is unchanged.   PHYSICAL EXAMINATION:  VITAL SIGNS:  Height is 6 feet.  Weight is 238.  Blood pressure 168/98, pulse 74 and regular, respirations 12.  The  patient is afebrile.  GENERAL:  This is a healthy-appearing, well-developed, conscious, alert  and appropriate 70 year old gentleman who appears to be in no extreme  distress.  HEENT:  Head was normocephalic.  Pupils equal, round and reactive.  Extraocular movements intact.  Oral buccal mucosa was pink and moist.  NECK:  Supple.  No palpable lymphadenopathy.  He had good range of  motion of his cervical spine without difficulty or tenderness.  CHEST:  He is slightly kyphotic but no signs of any wheezes, rales or  rhonchi.  His lung sounds were clear.  HEART:  Regular rate and rhythm.  No murmurs, rubs or gallops.  ABDOMEN:  Soft and nontender.  Bowel sounds present.  No CVA region  tenderness.  EXTREMITIES:  Upper Extremities:  Symmetrical in size and shape.  He had  full range of motion of his shoulders, elbows and wrists.  Motor  strength was 5/5.  Lower Extremities:  Right and left hips had full  extension and flexion up to 120 degrees with 20 degrees of internal and  external rotation without any discomfort.  Left knee had a well-healed  midline surgical incision.  He was able to fully extend and flex it back  to about 110 degrees.  Right knee was round and boggy-appearing.  No  signs of erythema or ecchymosis.  No effusion.  He was tender along the  joint lines medially and laterally.  He was able to extend it to about  10 degrees short of full extension and flex it back to about 95 degrees.  He had no instability.  Calves were soft and nontender.  The ankles were  symmetrical with good dorsi and plantar flexion.  PERIPHERAL VASCULAR:  Carotid pulses were 2+.  No bruits.  Radial pulses  were 2+.  Dorsalis pedis were 1+.  He had no lower extremity edema or  venous stasis changes.  NEURO:  The patient was conscious, alert and  appropriate.  Easy  conversation with examiner.  He had no gross neurologic defects noted.   Breast, rectal and GU exams were deferred at this time.   IMPRESSION:  1. End-stage osteoarthritis, right knee.  2. Hypertension.  3. History of kidney stones.  4. History of recent left total knee arthroplasty.   PLAN:  The patient is in the process of going through a medical  clearance for this upcoming surgical procedure.  He will undergo all  routine labs and tests prior to having a right total knee arthroplasty  by Dr. Darrelyn Hillock at [facility] on December 27.      Jamelle Rushing, P.A.    ______________________________  Georges Lynch Darrelyn Hillock, M.D.    RWK/MEDQ  D:  10/29/2006  T:  10/29/2006  Job:  [id number]   cc:   Windy Fast A. Darrelyn Hillock, M.D.  Fax: [phone]

## 2011-04-06 NOTE — Op Note (Signed)
NAMEBOSCO, PAPARELLA                 ACCOUNT NO.:  [id number]   MEDICAL RECORD NO.:  [id number]          PATIENT TYPE:  INP   LOCATION:  X005                         FACILITY:  [facility]   PHYSICIAN:  Georges Lynch. Gioffre, M.D.DATE OF BIRTH:  [birth year]   DATE OF PROCEDURE:  11/14/2006  DATE OF DISCHARGE:                               OPERATIVE REPORT   SURGEON:  Georges Lynch. Darrelyn Hillock, M.D.   OPERATIONS:  Arlyn Leak, PA.   PREOPERATIVE DIAGNOSIS:  Severe degenerative arthritis, right knee.   POSTOPERATIVE DIAGNOSIS:  Severe degenerative arthritis, right knee.   OPERATION:  Right total knee arthroplasty.   The system used was a Publishing rights manager.  We utilized the  Smart Set bone cement with vancomycin.  The sizes of the prosthesis was  a size 5 right posterior cruciate-sacrificing femoral component, a size  5 tibial tray with a size 5, 10 mm thickness insert.  The patella was a  size 41 mm patella.  All three components were cemented.   PROCEDURE:  Under spinal anesthesia, routine orthopedic prep and draping  of the right lower extremity was carried out.  The patient had had 2 g  of IV Ancef preop.  The leg was exsanguinated with an Esmarch and the  tourniquet was elevated at 375 mmHg.  Following that, an incision was  made over the anterior aspect of the right knee, bleeders identified and  cauterized.  Two flaps were created.  I then carried out a median  parapatellar incision.  I reflected the patella laterally, flexed the  knee, and did medial and lateral meniscectomies, and excised the  anterior and posterior cruciate ligaments.  Following that, initial  drill hole was placed  in the intercondylar notch of the femur.  The  guide rod was inserted and a 12 mm thickness was taken off the distal  femur.  The #2 jig was inserted.  We measured the femur to be a size 5.  The appropriate cuts were made with the third jig.  We carried out  anterior, posterior and chamfering cuts.   Once the femur was prepared,  we then prepared the tibia.  The tibial plateau was measured be a size  5.  We made our appropriate cuts.  We removed 6 mm thickness off the  affected medial side.  We then cut our keel cut out of the tibial  plateau in the usual fashion.  We then went back to the femur and cut  our notch cut out of the femur in the usual fashion.  We thoroughly  irrigated the knee out.  We then utilized our spacers to measure our  tension and our gaps in flexion and extension.  Following that, we then  went on and cut our patella for a size 41 patella.  We did a resurfacing-  type patella.  Three drill holes were made in the patella.  We selected  a size 41 mm patella.  We thoroughly water picked out the knee, cleaned  the knee out, and then cemented all three components in simultaneously.  All loose pieces  of cement were removed.  We then removed the trial  tibial insert, once again inspected for cement.  We then inserted our  permanent size 5, 10 mm thickness tibial rotating platform  insert, reduced the knee, took the knee through motion.  We had  excellent function and excellent stability.  We then thoroughly water  picked out the knee again inserted a Hemovac drain and closed the knee  in layers in the usual fashion.  Sterile Neosporin dressings were  applied.           ______________________________  Georges Lynch Darrelyn Hillock, M.D.     RAG/MEDQ  D:  11/14/2006  T:  11/14/2006  Job:  [id number]

## 2011-04-06 NOTE — H&P (Signed)
NAME:  Preston Green, Preston Green                 ACCOUNT NO.:  [id number]   MEDICAL RECORD NO.:  [id number]          PATIENT TYPE:  INP   LOCATION:  NA                           FACILITY:  [facility]   PHYSICIAN:  Georges Lynch. Gioffre, M.D.DATE OF BIRTH:  Jun 10, 1941   DATE OF ADMISSION:  11/14/2005  DATE OF DISCHARGE:                                HISTORY & PHYSICAL   CHIEF COMPLAINT:  Left knee pain.   HISTORY OF PRESENT ILLNESS:  The patient is a 70 year old gentleman who has  been evaluated by Dr. Darrelyn Hillock for bilateral knee pain.  He has failed  conservative treatment including medications and arthroscopy. X-rays reveal  significant arthritic changes throughout his left knee.  He does have  significant pain at night causing very difficult sleep with constant deep  aching sensation that increases with activity.  He is in today for  evaluation for a left total knee arthroplasty by Dr. Darrelyn Hillock.   ALLERGIES:  RINDEE given in the 1980s.   CURRENT MEDICATIONS:  1.  Hydrochlorothiazide 25 mg a day.  2.  Zyrtec 10 mg a day.  3.  Lipitor 40 mg a day.  4.  Enalapril 2 tablets 20 mg a day.  5.  Nasonex nasal spray p.r.n.   PAST MEDICAL HISTORY:  1.  Hypertension.  2.  History of kidney stones.  3.  Osteoarthritis bilateral knees.   PAST SURGICAL HISTORY:  1.  Circumcision.  2.  Left knee arthroscopy.   The patient indicates the only complication was some hyperreactivity after  awakening from his left knee surgery.   SOCIAL HISTORY:  The patient is married.  He is currently retired.  Lives in  a [place] home. Denies any history of smoking or alcohol use or children.  His wife will be his care giver postop.   FAMILY MEDICAL HISTORY:  Grandparents both with cardiac disease and are  deceased.  Mother is alive at 84 without any significant medical issues.  Father is 21 with arthritis.   REVIEW OF SYSTEMS:  Positive for history of tension headaches related to  work which resolved after retiring.   Hemorrhoids, ulcers, history of kidney  stones requiring lithotripsy.  History of skin lesions removed earlier this  year which were not malignant.  Otherwise other Review of Systems categories  were negative.   PHYSICAL EXAMINATION:  VITAL SIGNS:  Height 6 feet.  Weight 125.  Blood  pressure 148/70, pulse 80, respirations 12, temperature 98.  GENERAL:  This is a healthy-appearing, well-developed, conscious, alert,  appropriate 70 year old white male who transitions fairly easily on and off,  but he does appear to have some stiffness in bilateral knees.  HEENT:  Head was normocephalic.  Pupils equal, round, and reactive.  Extraocular movements intact.  Sclerae not icteric.  External ears were  without deformity.  Gross hearing is intact.  NECK:  Supple.  No palpable lymphadenopathy.  Thyroid region was nontender.  He had good range of motion of the cervical spine without any discomfort.  CHEST:  Lung sounds were clear and equal bilaterally.  No wheezes, rales,  rhonchi.  HEART: Regular rate and rhythm.  No murmurs, rubs, or gallops.  ABDOMEN:  Soft, nontender.  Bowel sounds normoactive.  No CVA region  discomfort.  UPPER EXTREMITIES:  Symmetric size and shape.  He had good range of motion  of shoulders, elbows, wrists.  Motor strength was 5/5.  LOWER EXTREMITIES:  Right and left hip had full extension and flexion up to  120 degrees with 20 internal and external rotation without any discomfort.  Left knee was full appearing, no signs of erythema, no ecchymosis.  He  lacked about 10 degrees extension, flexed it back to about 90 degrees with  just some general crepitus within the knees and just soreness throughout.  Calf was soft and nontender.  Right knee was full appearing.  No signs of  erythema or ecchymosis.  No effusion.  He had about 5 degrees lack of full  extension, flexed that back to about 100 degrees with some crepitus on the  patella.  No other instability.  The calves were  soft, nontender.  The  ankles were symmetrical with good dorsiflexion and plantar flexion.  PERIPHERAL VASCULAR:  Carotid pulses were 2+, bruits. Radial pulses were 2+.  Dorsalis pedis pulses were 2+.  He had trace edema in the lower extremities.  NEUROLOGIC: The patient was conscious, alert, and appropriate, held easy  conversation with examiner.  Cranial nerves II-XII grossly intact.  He had  no gross neurologic defects noted.  BREASTS/RECTAL/GU: Exams deferred at this time.   IMPRESSION:  1.  Severe osteoarthritis, left knee.  2.  Hypertension.  3.  History of kidney stones.  4.  History of seasonal allergies.   PLAN:  The patient will undergo all the routine lab and tests prior to  having a left total knee arthroplasty by Dr. Darrelyn Hillock at [facility]  on November 14, 2005.      Jamelle Rushing, P.A.    ______________________________  Georges Lynch Darrelyn Hillock, M.D.    RWK/MEDQ  D:  11/05/2005  T:  11/05/2005  Job:  [id number]

## 2011-04-06 NOTE — Discharge Summary (Signed)
Preston Green, Preston Green                 ACCOUNT NO.:  [id number]   MEDICAL RECORD NO.:  [id number]          PATIENT TYPE:  INP   LOCATION:  1519                         [facility]ACILITY:  [facility]   PHYSICIAN:  Georges Lynch. Gioffre, M.D.DATE OF BIRTH:  August 23, 1941   DATE OF ADMISSION:  11/14/2006  DATE OF DISCHARGE:  11/17/2006                               DISCHARGE SUMMARY   ADMISSION DIAGNOSES:  1. End-stage osteoarthritis right knee.  2. Hypertension.  3. History of kidney stones.  4. History of recent left total knee arthroplasty.   DISCHARGE DIAGNOSES:  1. Right total knee arthroplasty.  2. Hypertension.  3. History of kidney stones.  4. History of left total knee arthroplasty.   HISTORY OF PRESENT ILLNESS:  The patient is a 70 year old gentleman with  issues related to pain and range of motion of his right knee.  He has  been treated for long periods of time with conservative treatment for  his arthritis.  The patient is starting to lose range of motion and has  increased pain with ambulation, remains swollen.  Conservative treatment  is failing.  The patient like to proceed with a total knee arthroplasty.   ALLERGIES:  No known drug allergies.   CURRENT MEDICATIONS:  1. Hydrochlorothiazide 25 mg a day.  2. Aspirin 81 mg a day.  3. Tylenol p.r.n.  4. Zyrtec 10 mg a day.  5. Nasonex p.r.n.   SURGICAL PROCEDURE:  On November 14, 2006, the patient was taken to the  OR by Dr. Worthy Rancher, assisted by Arlyn Leak, PA-C.  Under general  anesthesia the patient underwent a right total knee arthroplasty without  any complications.  Estimated blood loss was about 50 mL.  The patient  tolerated the procedure well.  The patient had the following components  implanted:  A size 41 mm three-peg patella, a size 5 right-side femoral  component, a size 5 keeled tibial tray, a size 5 10-mm polyethylene  bearing.  All components were implanted with polymethyl methacrylate  with vancomycin mixed in.   The patient was transferred to the recovery  room and then to the orthopedic floor in good condition to follow  routine protocol.   CONSULTS:  The following routine consults requested:  Physical therapy,  case management and pharmacy.   HOSPITAL COURSE:  On the December 27 the patient was admitted to [facility] under the care of Dr. Worthy Rancher.  The patient was taken  to the OR where a right total knee arthroplasty was performed.  The  patient tolerated the procedure well, was transferred to the recovery  room and then the orthopedic floor in good condition on IV pain  medicines, antibiotics, and starting his DVT protocol.  The patient then  incurred a total of 3 days postoperative care on the orthopedic floor in  which the patient's wound remained benign for any signs of infection.  The patient's leg remained neurologically intact.  The patient's vital  signs remained stable.  The patient had no medical complications.  The  patient worked well with physical therapy and it was  felt on  postoperative day #3 the patient was ready for discharge home.  His  wound did have a little bit of skin blisters but just local wound care  was necessary, so he was discharged home in good condition with  outpatient home health physical therapy arranged and follow-up  instructions.   LABORATORY:  H&H on December 30 found hemoglobin 11.0, hematocrit 30.9.  INR was 1.7.  Routine chemistries were sodium of 137, potassium of 3.6,  glucose 117, BUN 15, creatinine 1.0.  Urinalysis on admission was  normal.   MEDICATIONS UPON DISCHARGE FROM ORTHOPEDICS FLOOR:  1. Coumadin 7.5 mg a day.  2. Heparin 5000 units subcu q.12h.  3. Colace 100 mg b.i.d.  4. Ferrous sulfate 325 mg t.i.d.  5. Percocet one or two tablets every 4-6 hours p.r.n.  6. Reglan 10 mg p.o. q.8h. p.r.n.  7. Phenergan 25 mg p.o. q.6h. p.r.n.  8. Robaxin 500 mg p.o. q.6h. p.r.n.  9. Tylenol 1000 mg a day p.r.n.   10.Hydrochlorothiazide 25 mg p.o. daily.  11.Claritin 10 mg a day p.r.n.  12.Nasonex one spray in nares daily p.r.n.   DISCHARGE INSTRUCTIONS:  1. Diet:  No restrictions.  2. Activity:  The patient is to walk with the use of a walker and      follow physical therapy.  3. Wound care:  The patient should change dressing daily.  4. Medications:  The patient is resume routine home medications with      the addition of:      a.     Percocet one or two tablets every 4-6 hours for pain if       needed.      b.     Robaxin 500 mg one tablet three times a day for muscle       spasms if needed.      c.     Coumadin 5 mg a day unless changed by the pharmacy.   DISCHARGE FOLLOWUP:  1. The patient is to call [phone] for a follow-up appointment Dr.      Darrelyn Hillock in 2 weeks.  2. Home health physical therapy and protime draw to be done by      [place].   PATIENT'S CONDITION UPON DISCHARGE TO HOME:  Listed as improved and  good.      Preston Green, P.A.    ______________________________  Georges Lynch Darrelyn Hillock, M.D.   Preston Green  D:  12/11/2006  T:  12/11/2006  Job:  [id number]

## 2014-08-20 ENCOUNTER — Telehealth: Payer: Self-pay | Admitting: *Deleted

## 2014-08-20 NOTE — Telephone Encounter (Signed)
I called pt re: needing Wellness visit. He states he has new PCP outside of [facility].

## 2017-12-13 DIAGNOSIS — H26493 Other secondary cataract, bilateral: Secondary | ICD-10-CM | POA: Diagnosis not present

## 2017-12-13 DIAGNOSIS — H353131 Nonexudative age-related macular degeneration, bilateral, early dry stage: Secondary | ICD-10-CM | POA: Diagnosis not present

## 2017-12-13 DIAGNOSIS — H1851 Endothelial corneal dystrophy: Secondary | ICD-10-CM | POA: Diagnosis not present

## 2017-12-13 DIAGNOSIS — Z961 Presence of intraocular lens: Secondary | ICD-10-CM | POA: Diagnosis not present

## 2017-12-18 DIAGNOSIS — Z0000 Encounter for general adult medical examination without abnormal findings: Secondary | ICD-10-CM | POA: Diagnosis not present

## 2017-12-18 DIAGNOSIS — Z125 Encounter for screening for malignant neoplasm of prostate: Secondary | ICD-10-CM | POA: Diagnosis not present

## 2017-12-18 DIAGNOSIS — I10 Essential (primary) hypertension: Secondary | ICD-10-CM | POA: Diagnosis not present

## 2017-12-18 DIAGNOSIS — E7800 Pure hypercholesterolemia, unspecified: Secondary | ICD-10-CM | POA: Diagnosis not present

## 2017-12-18 DIAGNOSIS — J301 Allergic rhinitis due to pollen: Secondary | ICD-10-CM | POA: Diagnosis not present

## 2018-01-22 DIAGNOSIS — Z125 Encounter for screening for malignant neoplasm of prostate: Secondary | ICD-10-CM | POA: Diagnosis not present

## 2018-01-22 DIAGNOSIS — I10 Essential (primary) hypertension: Secondary | ICD-10-CM | POA: Diagnosis not present

## 2018-01-22 DIAGNOSIS — Z0000 Encounter for general adult medical examination without abnormal findings: Secondary | ICD-10-CM | POA: Diagnosis not present

## 2018-01-29 DIAGNOSIS — E7800 Pure hypercholesterolemia, unspecified: Secondary | ICD-10-CM | POA: Diagnosis not present

## 2018-01-29 DIAGNOSIS — I10 Essential (primary) hypertension: Secondary | ICD-10-CM | POA: Diagnosis not present

## 2018-01-29 DIAGNOSIS — Z0000 Encounter for general adult medical examination without abnormal findings: Secondary | ICD-10-CM | POA: Diagnosis not present

## 2018-01-29 DIAGNOSIS — R972 Elevated prostate specific antigen [PSA]: Secondary | ICD-10-CM | POA: Diagnosis not present

## 2018-01-29 DIAGNOSIS — R739 Hyperglycemia, unspecified: Secondary | ICD-10-CM | POA: Diagnosis not present

## 2018-02-19 DIAGNOSIS — R972 Elevated prostate specific antigen [PSA]: Secondary | ICD-10-CM | POA: Diagnosis not present

## 2018-04-24 DIAGNOSIS — R739 Hyperglycemia, unspecified: Secondary | ICD-10-CM | POA: Diagnosis not present

## 2018-04-24 DIAGNOSIS — I10 Essential (primary) hypertension: Secondary | ICD-10-CM | POA: Diagnosis not present

## 2018-04-24 DIAGNOSIS — R972 Elevated prostate specific antigen [PSA]: Secondary | ICD-10-CM | POA: Diagnosis not present

## 2018-04-24 DIAGNOSIS — E7800 Pure hypercholesterolemia, unspecified: Secondary | ICD-10-CM | POA: Diagnosis not present

## 2018-05-01 DIAGNOSIS — R739 Hyperglycemia, unspecified: Secondary | ICD-10-CM | POA: Diagnosis not present

## 2018-05-01 DIAGNOSIS — R972 Elevated prostate specific antigen [PSA]: Secondary | ICD-10-CM | POA: Diagnosis not present

## 2018-05-01 DIAGNOSIS — I10 Essential (primary) hypertension: Secondary | ICD-10-CM | POA: Diagnosis not present

## 2018-05-01 DIAGNOSIS — E7800 Pure hypercholesterolemia, unspecified: Secondary | ICD-10-CM | POA: Diagnosis not present

## 2018-07-25 DIAGNOSIS — R972 Elevated prostate specific antigen [PSA]: Secondary | ICD-10-CM | POA: Diagnosis not present

## 2018-07-25 DIAGNOSIS — R739 Hyperglycemia, unspecified: Secondary | ICD-10-CM | POA: Diagnosis not present

## 2018-08-01 DIAGNOSIS — Z23 Encounter for immunization: Secondary | ICD-10-CM | POA: Diagnosis not present

## 2018-08-01 DIAGNOSIS — I10 Essential (primary) hypertension: Secondary | ICD-10-CM | POA: Diagnosis not present

## 2018-08-01 DIAGNOSIS — R972 Elevated prostate specific antigen [PSA]: Secondary | ICD-10-CM | POA: Diagnosis not present

## 2018-08-01 DIAGNOSIS — J301 Allergic rhinitis due to pollen: Secondary | ICD-10-CM | POA: Diagnosis not present

## 2018-08-01 DIAGNOSIS — E7800 Pure hypercholesterolemia, unspecified: Secondary | ICD-10-CM | POA: Diagnosis not present

## 2018-08-13 DIAGNOSIS — R972 Elevated prostate specific antigen [PSA]: Secondary | ICD-10-CM | POA: Diagnosis not present

## 2018-09-02 DIAGNOSIS — R972 Elevated prostate specific antigen [PSA]: Secondary | ICD-10-CM | POA: Diagnosis not present

## 2018-10-27 DIAGNOSIS — N183 Chronic kidney disease, stage 3 (moderate): Secondary | ICD-10-CM | POA: Diagnosis not present

## 2018-10-27 DIAGNOSIS — R972 Elevated prostate specific antigen [PSA]: Secondary | ICD-10-CM | POA: Diagnosis not present

## 2018-10-27 DIAGNOSIS — R739 Hyperglycemia, unspecified: Secondary | ICD-10-CM | POA: Diagnosis not present

## 2018-10-31 DIAGNOSIS — R739 Hyperglycemia, unspecified: Secondary | ICD-10-CM | POA: Diagnosis not present

## 2018-10-31 DIAGNOSIS — R748 Abnormal levels of other serum enzymes: Secondary | ICD-10-CM | POA: Diagnosis not present

## 2018-10-31 DIAGNOSIS — R972 Elevated prostate specific antigen [PSA]: Secondary | ICD-10-CM | POA: Diagnosis not present

## 2018-10-31 DIAGNOSIS — E7800 Pure hypercholesterolemia, unspecified: Secondary | ICD-10-CM | POA: Diagnosis not present

## 2018-10-31 DIAGNOSIS — L989 Disorder of the skin and subcutaneous tissue, unspecified: Secondary | ICD-10-CM | POA: Diagnosis not present

## 2018-10-31 DIAGNOSIS — N183 Chronic kidney disease, stage 3 (moderate): Secondary | ICD-10-CM | POA: Diagnosis not present

## 2018-10-31 DIAGNOSIS — I10 Essential (primary) hypertension: Secondary | ICD-10-CM | POA: Diagnosis not present

## 2019-06-16 ENCOUNTER — Other Ambulatory Visit: Payer: Self-pay | Admitting: Internal Medicine

## 2019-06-16 DIAGNOSIS — M25512 Pain in left shoulder: Secondary | ICD-10-CM

## 2019-06-17 ENCOUNTER — Other Ambulatory Visit: Payer: Self-pay | Admitting: Internal Medicine

## 2019-06-17 DIAGNOSIS — Z77018 Contact with and (suspected) exposure to other hazardous metals: Secondary | ICD-10-CM

## 2019-07-14 ENCOUNTER — Other Ambulatory Visit: Payer: Self-pay

## 2019-07-14 ENCOUNTER — Ambulatory Visit
Admission: RE | Admit: 2019-07-14 | Discharge: 2019-07-14 | Disposition: A | Discharge status: Home / Self Care | Payer: Medicare Other | Source: Ambulatory Visit | Attending: Internal Medicine | Admitting: Internal Medicine

## 2019-07-14 DIAGNOSIS — M25512 Pain in left shoulder: Secondary | ICD-10-CM

## 2019-07-14 DIAGNOSIS — Z77018 Contact with and (suspected) exposure to other hazardous metals: Secondary | ICD-10-CM

## 2019-10-12 ENCOUNTER — Other Ambulatory Visit: Payer: Self-pay | Admitting: *Deleted

## 2019-10-12 DIAGNOSIS — Z20822 Contact with and (suspected) exposure to covid-19: Secondary | ICD-10-CM

## 2019-10-14 LAB — NOVEL CORONAVIRUS, NAA: SARS-CoV-2, NAA: NOT DETECTED

## 2020-11-04 DIAGNOSIS — R972 Elevated prostate specific antigen [PSA]: Secondary | ICD-10-CM | POA: Diagnosis not present

## 2020-12-23 DIAGNOSIS — H40042 Steroid responder, left eye: Secondary | ICD-10-CM | POA: Diagnosis not present

## 2020-12-23 DIAGNOSIS — H353131 Nonexudative age-related macular degeneration, bilateral, early dry stage: Secondary | ICD-10-CM | POA: Diagnosis not present

## 2020-12-23 DIAGNOSIS — H35033 Hypertensive retinopathy, bilateral: Secondary | ICD-10-CM | POA: Diagnosis not present

## 2020-12-23 DIAGNOSIS — H40013 Open angle with borderline findings, low risk, bilateral: Secondary | ICD-10-CM | POA: Diagnosis not present

## 2021-03-13 DIAGNOSIS — L308 Other specified dermatitis: Secondary | ICD-10-CM | POA: Diagnosis not present

## 2021-03-13 DIAGNOSIS — Z1283 Encounter for screening for malignant neoplasm of skin: Secondary | ICD-10-CM | POA: Diagnosis not present

## 2021-03-13 DIAGNOSIS — L570 Actinic keratosis: Secondary | ICD-10-CM | POA: Diagnosis not present

## 2021-03-13 DIAGNOSIS — X32XXXD Exposure to sunlight, subsequent encounter: Secondary | ICD-10-CM | POA: Diagnosis not present

## 2021-03-13 DIAGNOSIS — D225 Melanocytic nevi of trunk: Secondary | ICD-10-CM | POA: Diagnosis not present

## 2021-04-28 DIAGNOSIS — Z1331 Encounter for screening for depression: Secondary | ICD-10-CM | POA: Diagnosis not present

## 2021-04-28 DIAGNOSIS — K5900 Constipation, unspecified: Secondary | ICD-10-CM | POA: Diagnosis not present

## 2021-04-28 DIAGNOSIS — M25512 Pain in left shoulder: Secondary | ICD-10-CM | POA: Diagnosis not present

## 2021-04-28 DIAGNOSIS — E785 Hyperlipidemia, unspecified: Secondary | ICD-10-CM | POA: Diagnosis not present

## 2021-04-28 DIAGNOSIS — E669 Obesity, unspecified: Secondary | ICD-10-CM | POA: Diagnosis not present

## 2021-04-28 DIAGNOSIS — I872 Venous insufficiency (chronic) (peripheral): Secondary | ICD-10-CM | POA: Diagnosis not present

## 2021-04-28 DIAGNOSIS — R972 Elevated prostate specific antigen [PSA]: Secondary | ICD-10-CM | POA: Diagnosis not present

## 2021-04-28 DIAGNOSIS — R053 Chronic cough: Secondary | ICD-10-CM | POA: Diagnosis not present

## 2021-04-28 DIAGNOSIS — E1169 Type 2 diabetes mellitus with other specified complication: Secondary | ICD-10-CM | POA: Diagnosis not present

## 2021-04-28 DIAGNOSIS — I10 Essential (primary) hypertension: Secondary | ICD-10-CM | POA: Diagnosis not present

## 2021-05-05 DIAGNOSIS — R972 Elevated prostate specific antigen [PSA]: Secondary | ICD-10-CM | POA: Diagnosis not present

## 2021-05-12 DIAGNOSIS — N5201 Erectile dysfunction due to arterial insufficiency: Secondary | ICD-10-CM | POA: Diagnosis not present

## 2021-05-12 DIAGNOSIS — R972 Elevated prostate specific antigen [PSA]: Secondary | ICD-10-CM | POA: Diagnosis not present

## 2021-05-12 DIAGNOSIS — N400 Enlarged prostate without lower urinary tract symptoms: Secondary | ICD-10-CM | POA: Diagnosis not present

## 2021-08-08 DIAGNOSIS — H00024 Hordeolum internum left upper eyelid: Secondary | ICD-10-CM | POA: Diagnosis not present

## 2021-08-08 DIAGNOSIS — H40013 Open angle with borderline findings, low risk, bilateral: Secondary | ICD-10-CM | POA: Diagnosis not present

## 2021-09-11 DIAGNOSIS — H40052 Ocular hypertension, left eye: Secondary | ICD-10-CM | POA: Diagnosis not present

## 2021-09-11 DIAGNOSIS — H0014 Chalazion left upper eyelid: Secondary | ICD-10-CM | POA: Diagnosis not present

## 2021-09-26 DIAGNOSIS — H40052 Ocular hypertension, left eye: Secondary | ICD-10-CM | POA: Diagnosis not present

## 2021-09-26 DIAGNOSIS — H0014 Chalazion left upper eyelid: Secondary | ICD-10-CM | POA: Diagnosis not present

## 2021-10-23 DIAGNOSIS — Z23 Encounter for immunization: Secondary | ICD-10-CM | POA: Diagnosis not present

## 2021-12-04 ENCOUNTER — Other Ambulatory Visit (HOSPITAL_COMMUNITY): Payer: Self-pay | Admitting: Internal Medicine

## 2021-12-04 DIAGNOSIS — R011 Cardiac murmur, unspecified: Secondary | ICD-10-CM

## 2021-12-12 ENCOUNTER — Other Ambulatory Visit (HOSPITAL_COMMUNITY): Payer: Self-pay | Admitting: Internal Medicine

## 2021-12-12 ENCOUNTER — Ambulatory Visit (HOSPITAL_COMMUNITY)
Admission: RE | Admit: 2021-12-12 | Discharge: 2021-12-12 | Disposition: A | Discharge status: Home / Self Care | Payer: Medicare Other | Source: Ambulatory Visit | Attending: Internal Medicine | Admitting: Internal Medicine

## 2021-12-12 ENCOUNTER — Other Ambulatory Visit: Payer: Self-pay

## 2021-12-12 DIAGNOSIS — I119 Hypertensive heart disease without heart failure: Secondary | ICD-10-CM | POA: Insufficient documentation

## 2021-12-12 DIAGNOSIS — E785 Hyperlipidemia, unspecified: Secondary | ICD-10-CM | POA: Insufficient documentation

## 2021-12-12 DIAGNOSIS — R011 Cardiac murmur, unspecified: Secondary | ICD-10-CM | POA: Diagnosis present

## 2021-12-12 DIAGNOSIS — E119 Type 2 diabetes mellitus without complications: Secondary | ICD-10-CM | POA: Insufficient documentation

## 2021-12-12 DIAGNOSIS — I350 Nonrheumatic aortic (valve) stenosis: Secondary | ICD-10-CM | POA: Insufficient documentation

## 2021-12-12 DIAGNOSIS — I340 Nonrheumatic mitral (valve) insufficiency: Secondary | ICD-10-CM | POA: Diagnosis not present

## 2021-12-12 LAB — ECHOCARDIOGRAM COMPLETE
AR max vel: 1.89 cm2
AV Area VTI: 1.92 cm2
AV Area mean vel: 1.93 cm2
AV Mean grad: 9 mmHg
AV Peak grad: 17.5 mmHg
Ao pk vel: 2.09 m/s
S' Lateral: 2.3 cm

## 2024-01-06 ENCOUNTER — Encounter: Payer: Self-pay | Admitting: Podiatry

## 2024-01-06 ENCOUNTER — Ambulatory Visit: Payer: Medicare Other | Admitting: Podiatry

## 2024-01-06 ENCOUNTER — Ambulatory Visit (INDEPENDENT_AMBULATORY_CARE_PROVIDER_SITE_OTHER): Payer: Medicare Other

## 2024-01-06 DIAGNOSIS — M722 Plantar fascial fibromatosis: Secondary | ICD-10-CM | POA: Diagnosis not present

## 2024-01-06 DIAGNOSIS — D2372 Other benign neoplasm of skin of left lower limb, including hip: Secondary | ICD-10-CM

## 2024-01-06 MED ORDER — BETAMETHASONE SOD PHOS & ACET 6 (3-3) MG/ML IJ SUSP
3.0000 mg | Freq: Once | INTRAMUSCULAR | Status: AC
  Administered 2024-01-06: 3 mg via INTRA_ARTICULAR

## 2024-01-06 NOTE — Progress Notes (Signed)
   Chief Complaint  Patient presents with   Plantar Fasciitis    Patient states that he had a couple shots done in his left foot 2 in the 90s and 2 in 2008 and 2 in August of 2024. In the morning his foot is fine but when he walks all day his heel gets to burning and hurting and the the next morning it is ok but that evening it starts all over again. Where the doctor shot him with the needle is where it hurts.     Subjective: 83 y.o. male presenting today as a new patient for evaluation of pain and tenderness to the left heel.  He has a chronic history of plantar fasciitis.  He was most recently seen at [facility] and received a few injections which helped minimally.  He continues to have pain and tenderness.   No past medical history on file.   Objective: Physical Exam General: The patient is alert and oriented x3 in no acute distress.  Dermatology: Skin is warm, dry and supple bilateral lower extremities. Negative for open lesions or macerations bilateral.  Hyperkeratotic skin lesion noted to the plantar aspect of left heel with a central nucleated core  Vascular: Dorsalis Pedis and Posterior Tibial pulses palpable bilateral.  Capillary fill time is immediate to all digits.  Neurological: Grossly intact via light touch  Musculoskeletal: Tenderness to palpation to the plantar aspect of the left heel along the plantar fascia. All other joints range of motion within normal limits bilateral. Strength 5/5 in all groups bilateral.   Radiographic exam LT foot 01/06/2024: Normal osseous mineralization. Joint spaces preserved. No fracture/dislocation/boney destruction. No other soft tissue abnormalities or radiopaque foreign bodies.   Assessment: 1. Plantar fasciitis left foot 2.  Posterior plantar heel spur left 3.  Eccrine poroma left plantar heel  Plan of Care:  -Patient evaluated. Xrays reviewed.   -Injection of 0.5cc Celestone soluspan injected into the left plantar fascia.   -Excisional debridement of the hyperkeratotic eccrine poroma to the left heel performed today using a 312 scalpel.  Patient tolerated this well.  Salicylic acid and light dressing applied. -Continue wearing [place] shoes.  Patient rarely goes barefoot -Return to clinic 4 weeks   Felecia Shelling, DPM [facility]  Dr. Felecia Shelling, DPM    [address] [place], [place] [address]                Office [phone]  Fax [phone]

## 2024-02-10 ENCOUNTER — Ambulatory Visit: Payer: Medicare Other | Admitting: Podiatry

## 2024-02-17 ENCOUNTER — Encounter: Payer: Self-pay | Admitting: Podiatry

## 2024-02-17 ENCOUNTER — Ambulatory Visit (INDEPENDENT_AMBULATORY_CARE_PROVIDER_SITE_OTHER): Admitting: Podiatry

## 2024-02-17 DIAGNOSIS — J069 Acute upper respiratory infection, unspecified: Secondary | ICD-10-CM | POA: Insufficient documentation

## 2024-02-17 DIAGNOSIS — M722 Plantar fascial fibromatosis: Secondary | ICD-10-CM | POA: Insufficient documentation

## 2024-02-17 NOTE — Progress Notes (Signed)
   Chief Complaint  Patient presents with   Foot Pain    Follow up PF left   "Its been doing a lot better"    Subjective: 83 y.o. male presenting today for follow-up evaluation of pain and tenderness to the left heel.  He says that he is doing significantly better.  No pain or tenderness.  Brief history: Chronic history of plantar fasciitis.  He was most recently seen at [facility] and received a few injections which helped minimally.  He continues to have pain and tenderness.    No past medical history on file.   Objective: Physical Exam General: The patient is alert and oriented x3 in no acute distress.  Dermatology: Skin is warm, dry and supple bilateral lower extremities. Negative for open lesions or macerations bilateral.  Hyperkeratotic skin lesion noted to the plantar aspect of left heel with a central nucleated core  Vascular: Dorsalis Pedis and Posterior Tibial pulses palpable bilateral.  Capillary fill time is immediate to all digits.  Neurological: Grossly intact via light touch  Musculoskeletal: Negative for any appreciable tenderness to palpation to the plantar aspect of the left heel along the plantar fascia. All other joints range of motion within normal limits bilateral. Strength 5/5 in all groups bilateral.   Radiographic exam LT foot 01/06/2024: Normal osseous mineralization. Joint spaces preserved. No fracture/dislocation/boney destruction. No other soft tissue abnormalities or radiopaque foreign bodies.   Assessment: 1. Plantar fasciitis left foot 2.  Posterior plantar heel spur left 3.  Eccrine poroma left plantar heel  Plan of Care:  -Patient evaluated. -Overall the patient feels significantly better.  He has no pain or tenderness to the left heel -Continue wearing good supportive tennis shoes and sneakers.  Advise against going barefoot -Return to clinic as needed   Felecia Shelling, DPM [facility]  Dr. Felecia Shelling, DPM     [address] [place], [place] [address]                Office [phone]  Fax [phone]

## 2024-05-18 ENCOUNTER — Encounter: Payer: Self-pay | Admitting: Podiatry

## 2024-05-18 ENCOUNTER — Ambulatory Visit: Admitting: Podiatry

## 2024-05-18 VITALS — Ht 70.0 in | Wt 232.0 lb

## 2024-05-18 DIAGNOSIS — M722 Plantar fascial fibromatosis: Secondary | ICD-10-CM

## 2024-05-18 MED ORDER — BETAMETHASONE SOD PHOS & ACET 6 (3-3) MG/ML IJ SUSP
3.0000 mg | Freq: Once | INTRAMUSCULAR | Status: AC
  Administered 2024-05-18: 3 mg via INTRA_ARTICULAR

## 2024-05-18 NOTE — Progress Notes (Signed)
   Chief Complaint  Patient presents with   Ankle Pain    Pt is here due to left ankle pain states he would like an injection.    Subjective: 83 y.o. male presenting today for follow-up evaluation of plantar fasciitis to the left foot.  He states that he was having no pain until he wore a different pair of shoes that did not provide any arch support.  He has been experiencing pain over the last few weeks.  Requesting injection today   No past medical history on file.  Past Surgical History:  Procedure Laterality Date   REPLACEMENT TOTAL KNEE BILATERAL Bilateral 2006   Allergies  Allergen Reactions   Latex     Objective: Physical Exam General: The patient is alert and oriented x3 in no acute distress.  Dermatology: Skin is warm, dry and supple bilateral lower extremities. Negative for open lesions or macerations bilateral.   Vascular: Dorsalis Pedis and Posterior Tibial pulses palpable bilateral.  Capillary fill time is immediate to all digits.  Moderate chronic edema noted bilateral lower extremities  Neurological:  grossly intact via light touch   Musculoskeletal: Tenderness to palpation to the plantar aspect of the left heel along the plantar fascia. All other joints range of motion within normal limits bilateral. Strength 5/5 in all groups bilateral.   Radiographic exam LT foot 01/06/2024: Normal osseous mineralization. Joint spaces preserved. No fracture/dislocation/boney destruction. No other soft tissue abnormalities or radiopaque foreign bodies.  Large plantar heel spur noted on lateral view  Assessment: 1. Plantar fasciitis left foot 2.  Large plantar heel spur  Plan of Care:  -Patient evaluated. Xrays reviewed.   -Injection of 0.5cc Celestone  soluspan injected into the left plantar fascia.  -Continue taking OTC motrin PRN -Continue good supportive tennis shoes -Return to clinic PRN   Thresa EMERSON Sar, DPM [facility]  Dr. Thresa EMERSON Sar, DPM     [address] [place], [place] [address]                Office [phone]  Fax [phone]
